# Patient Record
Sex: Female | Born: 1965 | Race: White | Hispanic: No | Marital: Single | Smoking: Never smoker
Health system: Southern US, Community
[De-identification: ages and names within clinical notes are randomized; demographics above are authoritative.]

## PROBLEM LIST (undated history)

## (undated) HISTORY — PX: LIPOMA EXCISION: SHX5283

---

## 2000-11-22 ENCOUNTER — Emergency Department (HOSPITAL_COMMUNITY): Admission: EM | Admit: 2000-11-22 | Discharge: 2000-11-23 | Payer: Self-pay | Admitting: Emergency Medicine

## 2000-12-08 ENCOUNTER — Encounter: Admission: RE | Admit: 2000-12-08 | Discharge: 2000-12-08 | Payer: Self-pay | Admitting: Family Medicine

## 2000-12-08 ENCOUNTER — Encounter: Payer: Self-pay | Admitting: Family Medicine

## 2001-03-15 ENCOUNTER — Other Ambulatory Visit: Admission: RE | Admit: 2001-03-15 | Discharge: 2001-03-15 | Payer: Self-pay | Admitting: Obstetrics and Gynecology

## 2002-03-17 ENCOUNTER — Other Ambulatory Visit: Admission: RE | Admit: 2002-03-17 | Discharge: 2002-03-17 | Payer: Self-pay | Admitting: Obstetrics and Gynecology

## 2011-11-20 ENCOUNTER — Other Ambulatory Visit: Payer: Self-pay | Admitting: Family Medicine

## 2011-11-20 DIAGNOSIS — R1310 Dysphagia, unspecified: Secondary | ICD-10-CM

## 2011-11-28 ENCOUNTER — Other Ambulatory Visit: Payer: Self-pay

## 2012-01-13 ENCOUNTER — Other Ambulatory Visit: Payer: Self-pay | Admitting: Family Medicine

## 2012-01-13 ENCOUNTER — Ambulatory Visit
Admission: RE | Admit: 2012-01-13 | Discharge: 2012-01-13 | Disposition: A | Discharge status: Home / Self Care | Payer: BC Managed Care – PPO | Source: Ambulatory Visit | Attending: Family Medicine | Admitting: Family Medicine

## 2012-01-13 DIAGNOSIS — S060X0A Concussion without loss of consciousness, initial encounter: Secondary | ICD-10-CM

## 2012-01-13 DIAGNOSIS — S0010XA Contusion of unspecified eyelid and periocular area, initial encounter: Secondary | ICD-10-CM

## 2013-01-21 ENCOUNTER — Encounter (INDEPENDENT_AMBULATORY_CARE_PROVIDER_SITE_OTHER): Payer: Self-pay | Admitting: General Surgery

## 2013-01-21 ENCOUNTER — Ambulatory Visit (INDEPENDENT_AMBULATORY_CARE_PROVIDER_SITE_OTHER): Payer: BC Managed Care – PPO | Admitting: General Surgery

## 2013-01-21 VITALS — BP 124/84 | HR 73 | Temp 97.2°F | Resp 16 | Ht 61.0 in | Wt 103.2 lb

## 2013-01-21 DIAGNOSIS — D171 Benign lipomatous neoplasm of skin and subcutaneous tissue of trunk: Secondary | ICD-10-CM

## 2013-01-21 DIAGNOSIS — D1739 Benign lipomatous neoplasm of skin and subcutaneous tissue of other sites: Secondary | ICD-10-CM

## 2013-01-21 NOTE — Patient Instructions (Addendum)

## 2013-01-21 NOTE — Progress Notes (Signed)
Chief complaint: Enlarging mass right flank  History: Patient is a 47 year old female referred by Dr. Juluis Rainier for an enlarging symptomatic mass along her right flank. She states this has been present for over 15 years. Observation was initially recommended. However recently she has noted some enlargement and has become tender and uncomfortable. She has no other similar areas or any previous surgery in this area.  History reviewed. No pertinent past medical history. History reviewed. No pertinent past surgical history. Current Outpatient Prescriptions  Medication Sig Dispense Refill  . aspirin 325 MG tablet Take 325 mg by mouth as needed for pain.      Marland Kitchen b complex vitamins tablet Take 1 tablet by mouth daily.      . Calcium-Vitamin D 600-125 MG-UNIT TABS Take by mouth.      . Multiple Vitamins-Minerals (MULTIVITAMIN WITH MINERALS) tablet Take 1 tablet by mouth daily.      . SPRINTEC 28 0.25-35 MG-MCG tablet        No current facility-administered medications for this visit.   Allergies  Allergen Reactions  . Bacitracin Itching and Rash   History  Substance Use Topics  . Smoking status: Never Smoker   . Smokeless tobacco: Not on file  . Alcohol Use: Yes     Comment: socially   Exam: BP 124/84  Pulse 73  Temp(Src) 97.2 F (36.2 C) (Temporal)  Resp 16  Ht 5\' 1"  (1.549 m)  Wt 103 lb 3.2 oz (46.811 kg)  BMI 19.51 kg/m2  SpO2 98% General: Thin healthy-appearing Caucasian female Lungs: Clear good breath sounds bilaterally Chest wall: Along the right flank laterally just above the costal margin is a 4 x 2 cm soft fleshy freely movable subcutaneous mass most consistent with lipoma Cardiac: Regular rate and rhythm. No edema Extremities: No edema or deformity Neurologic: Alert fully oriented. Gait normal.  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 Due to enlargement and symptoms of recommended excision. We discussed  removal under local anesthesia with sedation as an outpatient. Risks of anesthetic complications, bleeding, infection recurrence were discussed and understood and she is in agreement.

## 2013-03-09 ENCOUNTER — Other Ambulatory Visit: Payer: Self-pay | Admitting: Obstetrics and Gynecology

## 2013-03-09 DIAGNOSIS — R5381 Other malaise: Secondary | ICD-10-CM

## 2013-03-18 ENCOUNTER — Other Ambulatory Visit (INDEPENDENT_AMBULATORY_CARE_PROVIDER_SITE_OTHER): Payer: Self-pay | Admitting: *Deleted

## 2013-03-18 DIAGNOSIS — D1739 Benign lipomatous neoplasm of skin and subcutaneous tissue of other sites: Secondary | ICD-10-CM

## 2013-03-18 MED ORDER — HYDROCODONE-ACETAMINOPHEN 5-325 MG PO TABS: 1.0000 | ORAL_TABLET | ORAL | Status: DC | PRN

## 2013-03-23 ENCOUNTER — Telehealth (INDEPENDENT_AMBULATORY_CARE_PROVIDER_SITE_OTHER): Payer: Self-pay

## 2013-03-23 NOTE — Telephone Encounter (Signed)
Called and left message for patient to make her aware that I am unable to schedule appointment with Dr. Johna Sheriff before or on 04/01/13 for evaluation of lipoma.  I will call patient if I have any cancellations or we can see if another provider has appointment opening if she's willing to see someone else.

## 2013-03-23 NOTE — Telephone Encounter (Signed)
Message copied by Maryan Puls on Wed Mar 23, 2013  3:46 PM ------      Message from: Zacarias Pontes      Created: Fri Mar 18, 2013  4:48 PM       Deasiah Hagberg.Pt needs po apt to see Dr Johna Sheriff.She is requesting Dec 5th early am please.She will be out of town through the 21st and wants to be seen before she leaves...[phone] ------

## 2013-03-31 ENCOUNTER — Telehealth (INDEPENDENT_AMBULATORY_CARE_PROVIDER_SITE_OTHER): Payer: Self-pay

## 2013-03-31 NOTE — Telephone Encounter (Signed)
Called patient and rescheduled po appt to 04/29/2013.  Patient reports her incision is healing well.  Patient denies having any redness, swelling or drainage from incision.

## 2013-03-31 NOTE — Telephone Encounter (Signed)
Message copied by Maryan Puls on Thu Mar 31, 2013 11:18 AM ------      Message from: Louie Casa      Created: Wed Mar 23, 2013  5:03 PM      Regarding: Dr. Johna Sheriff po appt      Contact: [phone]       Patient get your message no appointments on 04/01/13 but she wants to let you know if can find an appointment before 04/08/13 anytime or please call her.            Thank you. ------

## 2013-04-29 ENCOUNTER — Ambulatory Visit (INDEPENDENT_AMBULATORY_CARE_PROVIDER_SITE_OTHER): Payer: BC Managed Care – PPO | Admitting: General Surgery

## 2013-04-29 ENCOUNTER — Encounter (INDEPENDENT_AMBULATORY_CARE_PROVIDER_SITE_OTHER): Payer: Self-pay | Admitting: General Surgery

## 2013-04-29 VITALS — BP 109/64 | HR 67 | Temp 97.7°F | Resp 14 | Ht 61.0 in | Wt 101.2 lb

## 2013-04-29 DIAGNOSIS — Z09 Encounter for follow-up examination after completed treatment for conditions other than malignant neoplasm: Secondary | ICD-10-CM

## 2013-04-29 NOTE — Patient Instructions (Signed)
I will call you with a report on the pathology

## 2013-04-29 NOTE — Progress Notes (Addendum)
History: Patient returns several weeks following excision of a symptomatic lipoma from her right chest wall. She reports no problems with just some minor tightness at the incision it continues to improve.  Exam: Her wound is very nicely healed with no seroma or other complication.  Her pathology report is not on Epic. I told her I would check at the surgical center and get back to her about this. She is doing well and is discharged return as needed.  Note: Pathology report was obtained which revealed a benign lipoma and patient was provided with this information.

## 2013-05-13 ENCOUNTER — Encounter (INDEPENDENT_AMBULATORY_CARE_PROVIDER_SITE_OTHER): Payer: BC Managed Care – PPO | Admitting: General Surgery

## 2013-05-23 ENCOUNTER — Encounter (INDEPENDENT_AMBULATORY_CARE_PROVIDER_SITE_OTHER): Payer: Self-pay

## 2014-01-30 ENCOUNTER — Other Ambulatory Visit: Payer: Self-pay | Admitting: Obstetrics and Gynecology

## 2014-01-30 DIAGNOSIS — M8580 Other specified disorders of bone density and structure, unspecified site: Secondary | ICD-10-CM

## 2017-03-27 DIAGNOSIS — N898 Other specified noninflammatory disorders of vagina: Secondary | ICD-10-CM | POA: Insufficient documentation

## 2017-03-27 DIAGNOSIS — Z780 Asymptomatic menopausal state: Secondary | ICD-10-CM | POA: Insufficient documentation

## 2019-03-30 ENCOUNTER — Other Ambulatory Visit: Payer: Self-pay | Admitting: Obstetrics and Gynecology

## 2019-03-30 DIAGNOSIS — N644 Mastodynia: Secondary | ICD-10-CM

## 2019-04-15 ENCOUNTER — Ambulatory Visit: Payer: BC Managed Care – PPO

## 2019-04-15 ENCOUNTER — Ambulatory Visit
Admission: RE | Admit: 2019-04-15 | Discharge: 2019-04-15 | Disposition: A | Discharge status: Home / Self Care | Payer: BC Managed Care – PPO | Source: Ambulatory Visit | Attending: Obstetrics and Gynecology | Admitting: Obstetrics and Gynecology

## 2019-04-15 ENCOUNTER — Other Ambulatory Visit: Payer: Self-pay

## 2019-04-15 DIAGNOSIS — N644 Mastodynia: Secondary | ICD-10-CM

## 2020-12-10 ENCOUNTER — Other Ambulatory Visit: Payer: Self-pay

## 2020-12-10 ENCOUNTER — Ambulatory Visit: Payer: BC Managed Care – PPO | Admitting: Podiatry

## 2020-12-10 ENCOUNTER — Encounter: Payer: Self-pay | Admitting: Podiatry

## 2020-12-10 ENCOUNTER — Ambulatory Visit (INDEPENDENT_AMBULATORY_CARE_PROVIDER_SITE_OTHER): Payer: BC Managed Care – PPO

## 2020-12-10 DIAGNOSIS — M778 Other enthesopathies, not elsewhere classified: Secondary | ICD-10-CM

## 2020-12-10 DIAGNOSIS — M21612 Bunion of left foot: Secondary | ICD-10-CM

## 2020-12-10 DIAGNOSIS — M2062 Acquired deformities of toe(s), unspecified, left foot: Secondary | ICD-10-CM

## 2020-12-10 DIAGNOSIS — Z8262 Family history of osteoporosis: Secondary | ICD-10-CM | POA: Insufficient documentation

## 2020-12-10 DIAGNOSIS — N951 Menopausal and female climacteric states: Secondary | ICD-10-CM | POA: Insufficient documentation

## 2020-12-12 NOTE — Progress Notes (Signed)
Subjective:   Patient ID: Linda Blake, female   DOB: 55 y.o.   MRN: [id number]   HPI 55 year old female presents the office today for concerns of a painful bunion on her left foot.  She states that hurting the shoes and with pressure.  Occasionally some swelling.  She states the big toe is starting to go over the second toe.  Previously has a history of bunion surgery on the right side 20 years or more ago.  On the left side she has tried shoe modifications, offloading padding without significant resolution.  She does have rheumatoid arthritis and she takes occasional aspirin as well as Celebrex.  No biological medications at this time.   Review of Systems  All other systems reviewed and are negative.  History reviewed. No pertinent past medical history.  Past Surgical History:  Procedure Laterality Date   LIPOMA EXCISION       Current Outpatient Medications:    celecoxib (CELEBREX) 100 MG capsule, celecoxib 100 mg capsule  TAKE 1 CAPSULE BY MOUTH EVERY DAY WITH FOOD AS NEEDED, Disp: , Rfl:    aspirin 325 MG tablet, Take 325 mg by mouth as needed for pain., Disp: , Rfl:    b complex vitamins tablet, Take 1 tablet by mouth daily., Disp: , Rfl:    Calcium-Vitamin D 600-125 MG-UNIT TABS, Take by mouth., Disp: , Rfl:    conjugated estrogens (PREMARIN) vaginal cream, Premarin 0.625 mg/gram vaginal cream, Disp: , Rfl:    estradiol (ESTRACE) 0.1 MG/GM vaginal cream, SMARTSIG:1 Gram(s) Vaginal, Disp: , Rfl:   Allergies  Allergen Reactions   Bacitracin Itching and Rash          Objective:  Physical Exam  General: AAO x3, NAD  Dermatological: Skin is warm, dry and supple bilateral.  There are no open sores, no preulcerative lesions, no rash or signs of infection present.  Scar on the right foot well-healed from prior surgery.  Vascular: Dorsalis Pedis artery and Posterior Tibial artery pedal pulses are 2/4 bilateral with immedate capillary fill time. There is no pain with calf  compression, swelling, warmth, erythema.   Neruologic: Grossly intact via light touch bilateral.   Musculoskeletal: Mild to moderate bunion present on the left side.  There is no hypermobility present the first ray.  Tenderness on the first MPJ with range of motion.  Tenderness also along the medial aspect the first metatarsal head on the bunion.  No other areas of discomfort.  Muscular strength 5/5 in all groups tested bilateral.  Gait: Unassisted, Nonantalgic.       Assessment:   Left foot bunion, arthritis first MPJ     Plan:  -Treatment options discussed including all alternatives, risks, and complications -Etiology of symptoms were discussed -X-rays were obtained and reviewed with the patient.  Moderate bunion is present with arthritic changes present of the MPJ.  No evidence of acute fracture. -We discussed with conservative as well as surgical treatment options.  We discussed shoe modifications, arch supports as well as a stiffer soled shoe.  Offloading pads and consider injections if needed.  She has tried multiple treatments without resolution she elects proceed with surgical intervention.  At this point given the bunion as well as the arthritis recommended first MPJ arthrodesis.  We will tentatively plan for surgery on November 16 and we will get her scheduled for this as well as an insurance quote.  I will see her back prior for consent.  We did review the surgery as well  as postoperative course today.  Trula Slade DPM

## 2021-03-26 ENCOUNTER — Other Ambulatory Visit: Payer: Self-pay | Admitting: Obstetrics and Gynecology

## 2021-03-26 DIAGNOSIS — Z1231 Encounter for screening mammogram for malignant neoplasm of breast: Secondary | ICD-10-CM

## 2021-05-06 ENCOUNTER — Ambulatory Visit
Admission: RE | Admit: 2021-05-06 | Discharge: 2021-05-06 | Disposition: A | Discharge status: Home / Self Care | Payer: BC Managed Care – PPO | Source: Ambulatory Visit | Attending: Obstetrics and Gynecology | Admitting: Obstetrics and Gynecology

## 2021-05-06 ENCOUNTER — Other Ambulatory Visit: Payer: Self-pay

## 2021-05-06 DIAGNOSIS — Z1231 Encounter for screening mammogram for malignant neoplasm of breast: Secondary | ICD-10-CM

## 2021-05-17 ENCOUNTER — Other Ambulatory Visit (HOSPITAL_BASED_OUTPATIENT_CLINIC_OR_DEPARTMENT_OTHER): Payer: Self-pay | Admitting: Obstetrics and Gynecology

## 2021-05-17 DIAGNOSIS — Z13820 Encounter for screening for osteoporosis: Secondary | ICD-10-CM

## 2021-05-28 ENCOUNTER — Ambulatory Visit (HOSPITAL_BASED_OUTPATIENT_CLINIC_OR_DEPARTMENT_OTHER)
Admission: RE | Admit: 2021-05-28 | Discharge: 2021-05-28 | Disposition: A | Discharge status: Home / Self Care | Payer: BC Managed Care – PPO | Source: Ambulatory Visit | Attending: Obstetrics and Gynecology | Admitting: Obstetrics and Gynecology

## 2021-05-28 ENCOUNTER — Other Ambulatory Visit: Payer: Self-pay

## 2021-05-28 DIAGNOSIS — Z13820 Encounter for screening for osteoporosis: Secondary | ICD-10-CM | POA: Diagnosis not present

## 2022-01-16 ENCOUNTER — Ambulatory Visit (INDEPENDENT_AMBULATORY_CARE_PROVIDER_SITE_OTHER): Payer: Self-pay | Admitting: Plastic Surgery

## 2022-01-16 ENCOUNTER — Encounter: Payer: Self-pay | Admitting: Plastic Surgery

## 2022-01-16 DIAGNOSIS — Z411 Encounter for cosmetic surgery: Secondary | ICD-10-CM

## 2022-01-17 NOTE — Progress Notes (Signed)
Botulinum Toxin  Procedure: Cosmetic botulinum toxin  Pre-operative Diagnosis: Dynamic rhytides and midface volume loss  Post-operative Diagnosis: Same  Complications:  None  Brief history: The patient desires botulinum toxin injection of her forehead. I discussed with the patient this proposed procedure of botulinum toxin injections, which is customized depending on the particular needs of the patient. It is performed on facial rhytids as a temporary correction. The alternatives were discussed with the patient. The risks were addressed including bleeding, scarring, infection, damage to deeper structures, asymmetry, and chronic pain, which may occur infrequently after a procedure. The individual's choice to undergo a surgical procedure is based on the comparison of risks to potential benefits. Other risks include unsatisfactory results, brow ptosis, eyelid ptosis, allergic reaction, temporary paralysis, which should go away with time, bruising, blurring disturbances and delayed healing. Botulinum toxin injections do not arrest the aging process or produce permanent tightening of the eyelid.  Operative intervention maybe necessary to maintain the results of a blepharoplasty or botulinum toxin. The patient understands and wishes to proceed.  Procedure: The area was prepped with alcohol and dried with a clean gauze. Using a clean technique, the botulinum toxin was diluted with 2.5 cc of preservative-free normal saline which was slowly injected with an 18 gauge needle in a tuberculin syringes.  A 32 gauge needles were then used to inject the botulinum toxin. This mixture allow for an aliquot of 4 units per 0.1 cc in each injection site.    Subsequently the mixture was injected into the following regions: 15 U of botox was injected into the glabella.   Botox LOT:  [id number] [id number] EXP:  02/2024

## 2022-06-04 LAB — EXTERNAL GENERIC LAB PROCEDURE: COLOGUARD: NEGATIVE

## 2022-06-13 IMAGING — MG MM DIGITAL SCREENING BILAT W/ TOMO AND CAD
8 series · 9 of 24 positions shown · non-contrast
Comparison: Previous exam(s).

CLINICAL DATA: Screening.

EXAM:
DIGITAL SCREENING BILATERAL MAMMOGRAM WITH TOMOSYNTHESIS AND CAD
TECHNIQUE: Bilateral screening digital craniocaudal and mediolateral oblique
mammograms were obtained. Bilateral screening digital breast
tomosynthesis was performed. The images were evaluated with
computer-aided detection.

[R MLO synth-2D]
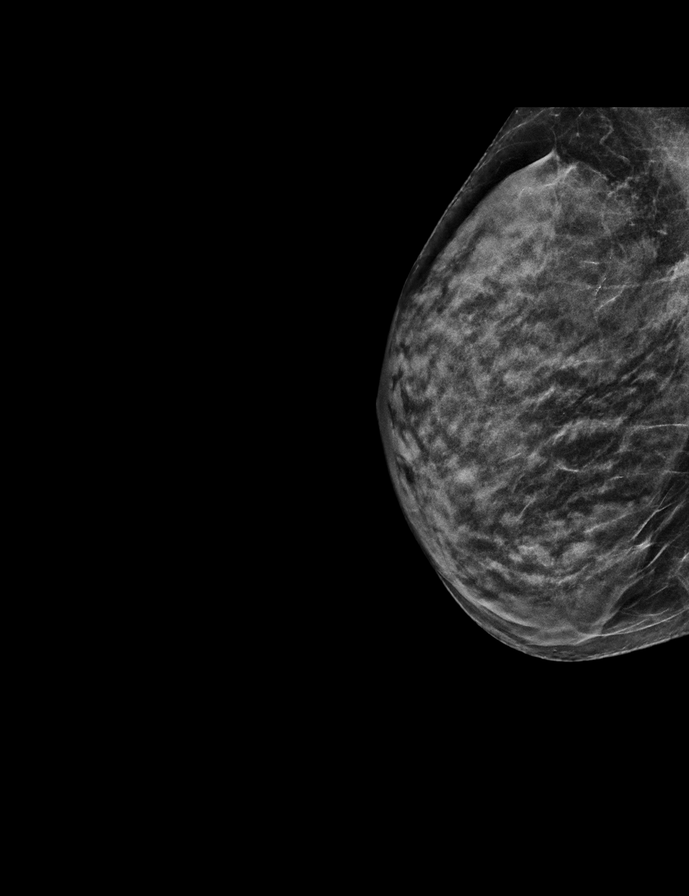

[L CC synth-2D]
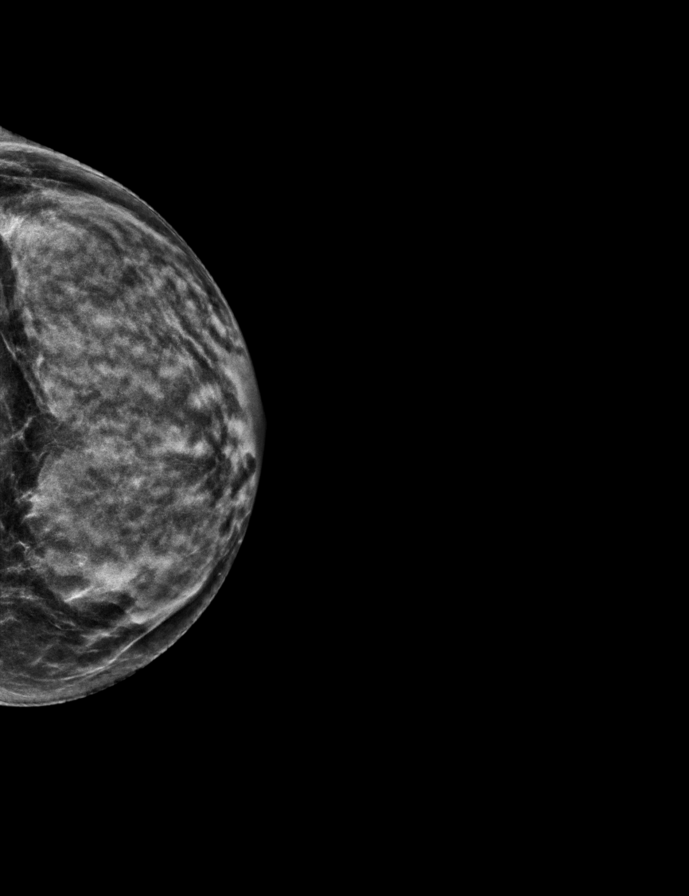

[L MLO synth-2D]
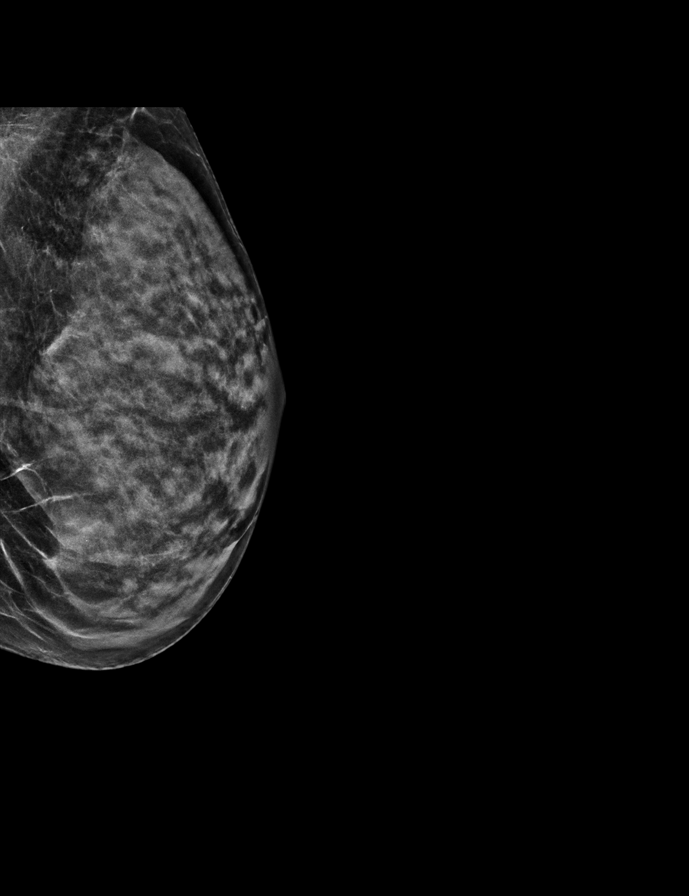

[R CC synth-2D]
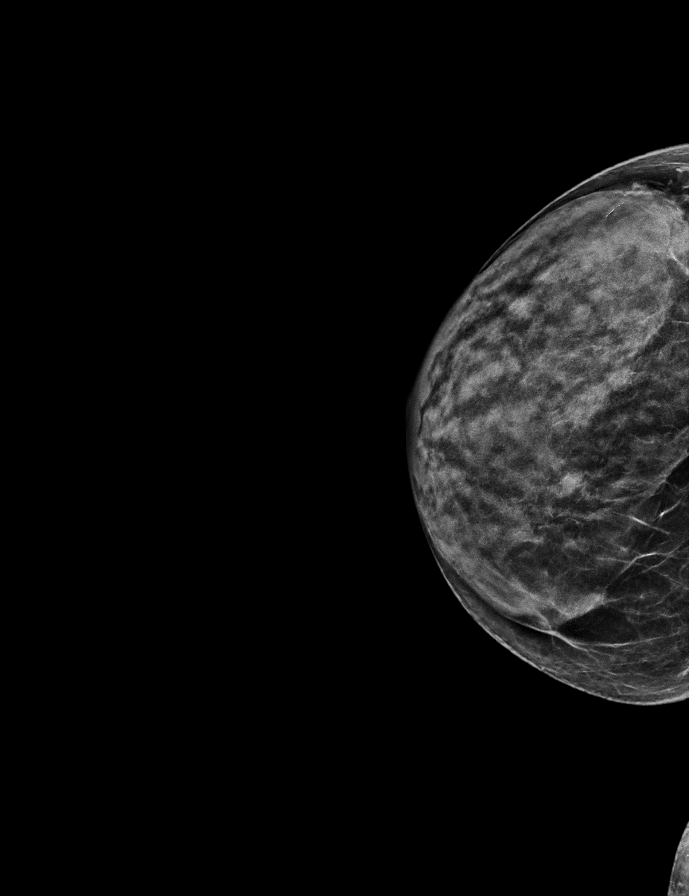

[R MLO tomo · 2 of 45 frames shown]
[frame 15/45]
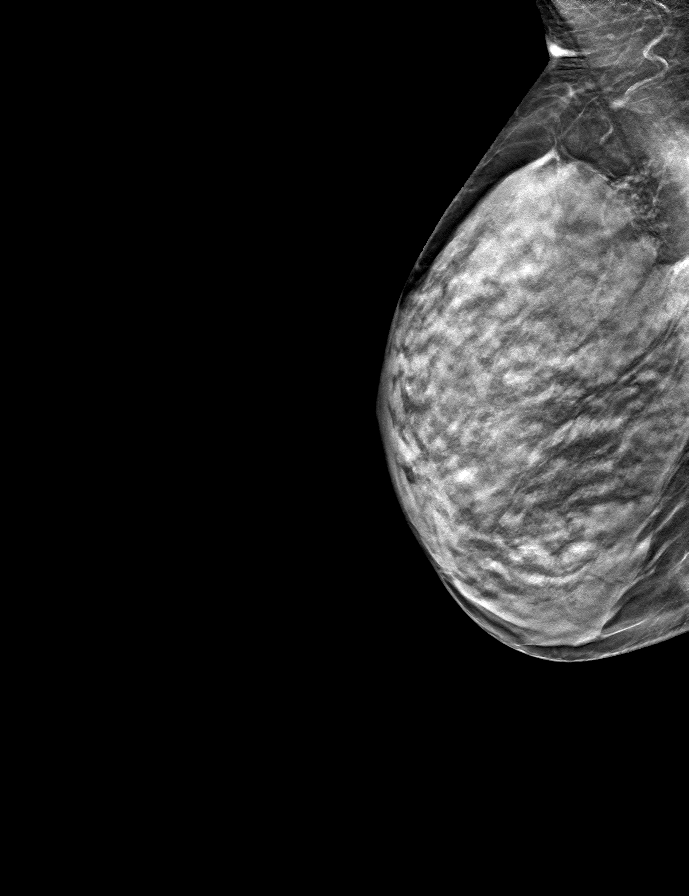
[frame 23/45]
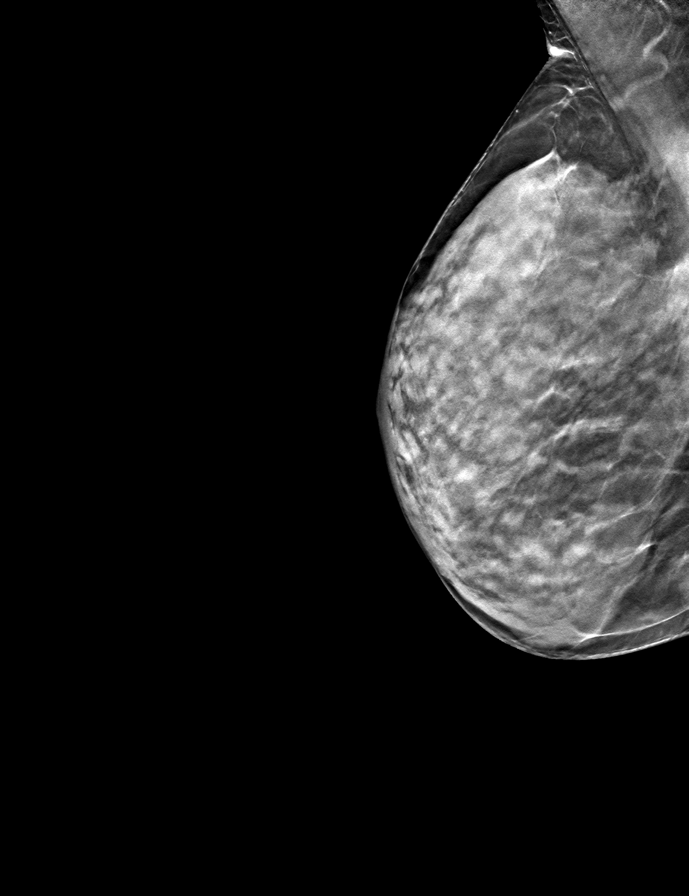

[R CC tomo · tomo slice 25/48.0]
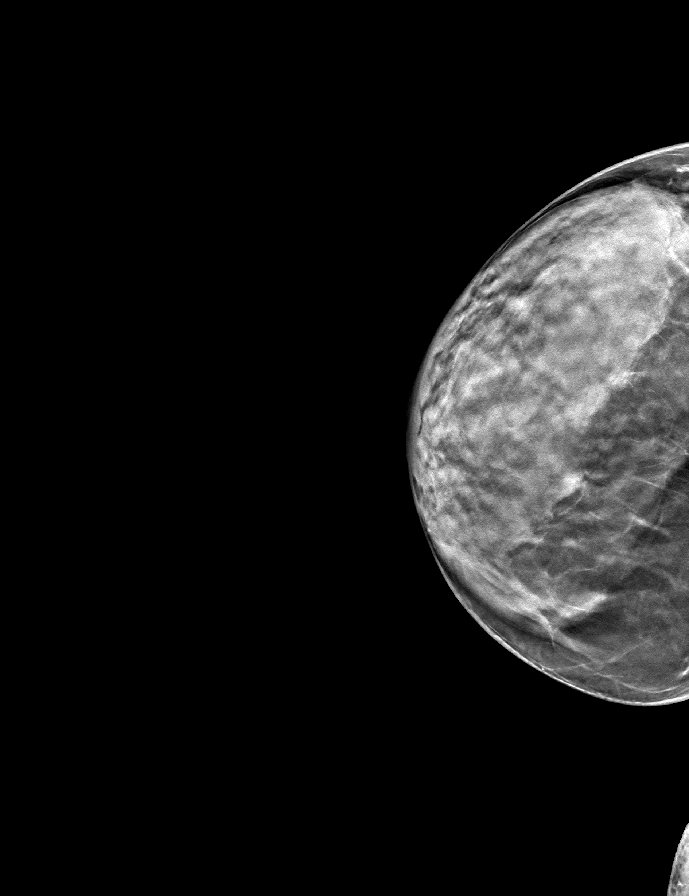

[L MLO tomo · tomo slice 23/46.0]
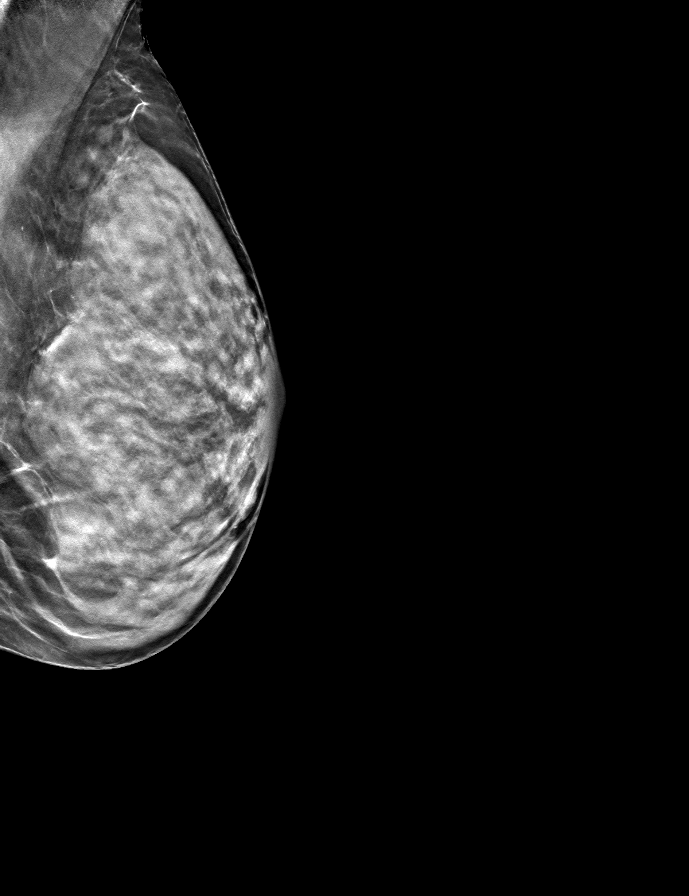

[L CC tomo · tomo slice 25/49.0]
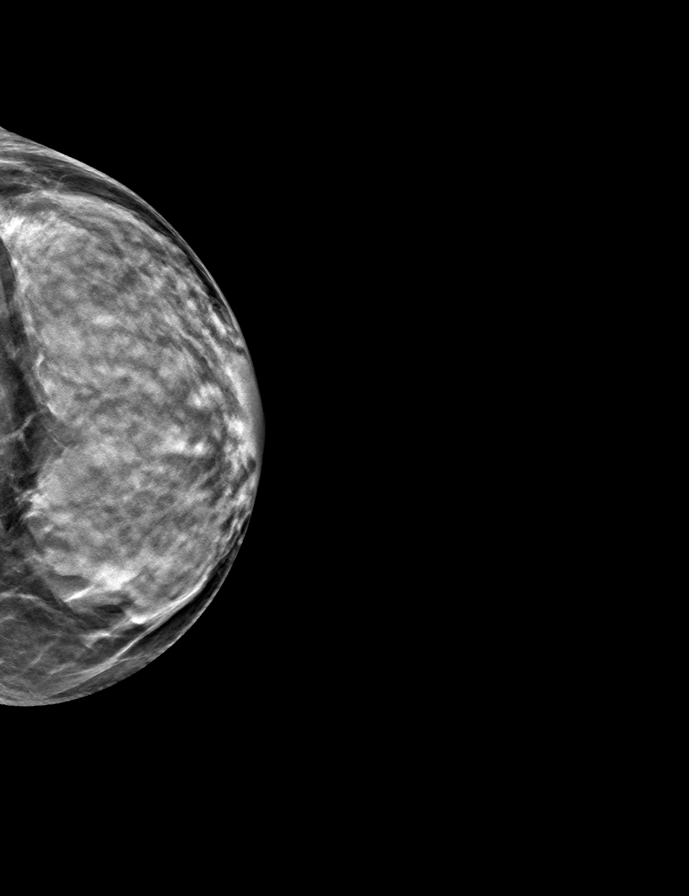

[9 of 24 positions shown; findings below may reference images not displayed]

ACR Breast Density Category d: The breast tissue is extremely dense,
which lowers the sensitivity of mammography
FINDINGS: There are no findings suspicious for malignancy.
IMPRESSION: No mammographic evidence of malignancy. A result letter of this
screening mammogram will be mailed directly to the patient.

RECOMMENDATION:
Screening mammogram in one year. (Code:TA-V-WV9)

BI-RADS CATEGORY  1: Negative.

## 2023-05-14 ENCOUNTER — Other Ambulatory Visit (HOSPITAL_BASED_OUTPATIENT_CLINIC_OR_DEPARTMENT_OTHER): Payer: Self-pay | Admitting: Nurse Practitioner

## 2023-05-14 DIAGNOSIS — M8588 Other specified disorders of bone density and structure, other site: Secondary | ICD-10-CM

## 2023-06-05 ENCOUNTER — Ambulatory Visit (HOSPITAL_BASED_OUTPATIENT_CLINIC_OR_DEPARTMENT_OTHER)
Admission: RE | Admit: 2023-06-05 | Discharge: 2023-06-05 | Disposition: A | Discharge status: Home / Self Care | Payer: 59 | Source: Ambulatory Visit | Attending: Nurse Practitioner | Admitting: Nurse Practitioner

## 2023-06-05 DIAGNOSIS — E2839 Other primary ovarian failure: Secondary | ICD-10-CM | POA: Insufficient documentation

## 2023-06-05 DIAGNOSIS — Z13820 Encounter for screening for osteoporosis: Secondary | ICD-10-CM | POA: Insufficient documentation

## 2023-06-05 DIAGNOSIS — M3500 Sicca syndrome, unspecified: Secondary | ICD-10-CM | POA: Insufficient documentation

## 2023-06-05 DIAGNOSIS — M069 Rheumatoid arthritis, unspecified: Secondary | ICD-10-CM | POA: Diagnosis not present

## 2023-06-05 DIAGNOSIS — M8588 Other specified disorders of bone density and structure, other site: Secondary | ICD-10-CM | POA: Diagnosis present

## 2023-06-05 DIAGNOSIS — Z780 Asymptomatic menopausal state: Secondary | ICD-10-CM | POA: Diagnosis present
